# Patient Record
Sex: Male | Born: 1995 | Race: White | Hispanic: No | Marital: Single | Smoking: Never smoker
Health system: Southern US, Community
[De-identification: ages and names within clinical notes are randomized; demographics above are authoritative.]

## PROBLEM LIST (undated history)

## (undated) DIAGNOSIS — F840 Autistic disorder: Secondary | ICD-10-CM

## (undated) DIAGNOSIS — B019 Varicella without complication: Secondary | ICD-10-CM

## (undated) DIAGNOSIS — R569 Unspecified convulsions: Secondary | ICD-10-CM

## (undated) DIAGNOSIS — T7840XA Allergy, unspecified, initial encounter: Secondary | ICD-10-CM

## (undated) DIAGNOSIS — M40209 Unspecified kyphosis, site unspecified: Secondary | ICD-10-CM

## (undated) DIAGNOSIS — M419 Scoliosis, unspecified: Secondary | ICD-10-CM

## (undated) DIAGNOSIS — K219 Gastro-esophageal reflux disease without esophagitis: Secondary | ICD-10-CM

## (undated) DIAGNOSIS — F909 Attention-deficit hyperactivity disorder, unspecified type: Secondary | ICD-10-CM

## (undated) DIAGNOSIS — Z8489 Family history of other specified conditions: Secondary | ICD-10-CM

## (undated) HISTORY — DX: Gastro-esophageal reflux disease without esophagitis: K21.9

## (undated) HISTORY — DX: Autistic disorder: F84.0

## (undated) HISTORY — DX: Scoliosis, unspecified: M41.9

## (undated) HISTORY — DX: Allergy, unspecified, initial encounter: T78.40XA

## (undated) HISTORY — DX: Varicella without complication: B01.9

## (undated) HISTORY — DX: Unspecified kyphosis, site unspecified: M40.209

## (undated) HISTORY — DX: Attention-deficit hyperactivity disorder, unspecified type: F90.9

---
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
## 2013-02-07 ENCOUNTER — Telehealth: Payer: Self-pay | Admitting: Internal Medicine

## 2013-02-07 NOTE — Telephone Encounter (Signed)
excedrine is low risk

## 2013-02-07 NOTE — Telephone Encounter (Signed)
Patient Information:  Caller Name: Alvino Chapel  Phone: [phone]  Patient: Marvin Thompson, Marvin Thompson  Gender: Male  DOB: [birth year]  Age: 17 Years  PCP: Darryll Capers (Adults only)  Office Follow Up:  Does the office need to follow up with this patient?: Yes  Instructions For The Office: Mom would like to know if MD thinks it is ok to use Excedrin for ha's or if there is something else she should use.  Using Ibuprofen 200-400 mg prn.  RN Note:  Please f/u with mom with suggestions for tx medication for ha's.  Is Excedrin ok?  Symptoms  Reason For Call & Symptoms: Headaches behind eyes.  Mom thinks it may be migraines and not sure if she could give him Excedrin?  Last seizure was 10/30 and noticed increase in ha's since then.  Always has a really bad ha after seizures for a few days but this seems to be lasting longer;  They are more frequent, pretty much daily.  No recent medication changes post the last seizure.  Sometimes see's s/s that he may be having visual disturbances or trying to clear his vision, notices this daily but has seen all these s/s before post seizure;  Did speak with Neuro at the time of the sz;  States it takes a few days to get thru to neuro so she would like to know if MD can suggest any other tx/medication's that would be ok to give?  Reviewed Health History In EMR: Yes  Reviewed Medications In EMR: Yes  Reviewed Allergies In EMR: Yes  Reviewed Surgeries / Procedures: Yes  Date of Onset of Symptoms: 02/23/2013  Treatments Tried: Ibuprofen 200 mg - 400 mg  Treatments Tried Worked: No  Guideline(s) Used:  Headache  Headache  Disposition Per Guideline:   Home Care  Reason For Disposition Reached:   Mild headache  Advice Given:  N/A  Patient Will Follow Care Advice:  YES

## 2013-02-07 NOTE — Telephone Encounter (Signed)
Pt's mom aware.

## 2013-02-11 ENCOUNTER — Other Ambulatory Visit: Payer: Self-pay

## 2013-02-11 DIAGNOSIS — F848 Other pervasive developmental disorders: Secondary | ICD-10-CM

## 2013-02-11 DIAGNOSIS — G40309 Generalized idiopathic epilepsy and epileptic syndromes, not intractable, without status epilepticus: Secondary | ICD-10-CM

## 2013-02-11 MED ORDER — INTUNIV 2 MG PO TB24: ORAL_TABLET | ORAL | Status: DC

## 2013-02-24 ENCOUNTER — Telehealth: Payer: Self-pay | Admitting: Internal Medicine

## 2013-02-24 NOTE — Telephone Encounter (Signed)
Seizure 02/21/13 for 2 minutes; treated at home.  Called neurologist and left message for follow up.  Neurologist recommended another MRI with general anesthesia but Marvin Thompson had one in March 2014.  Asking for Dr York Ram opinion regarding if repeat MRI is needed.  Child on Medicaid; pediatrician listed as primary. Having difficulty getting referrals.  Asking how soon will Dr Lovell Sheehan accept Medicaid patients?  Please call back.

## 2013-02-24 NOTE — Telephone Encounter (Signed)
Unsure of when we will accept medicaid The MRI is a reasonable idea

## 2013-02-25 NOTE — Telephone Encounter (Signed)
Pt mother would like to drop off urine sample to make sure pt does not have any inf that could have cause his seizure. Can I sch?

## 2013-02-25 NOTE — Telephone Encounter (Signed)
pts mom wanted to know when Dr Lovell Sheehan was going to get his name added to [facility] card.  Explained to mom that our office does not handle that and she needed to contact medicaid and ask them what she could do.  Told mom that Dr Lovell Sheehan recommended the MRI and she stated she could not get a call back.  Dr Modesto Charon was the one who is setting up referral at [facility].  Told mom that she would need to contact his office since we are not the ones referring.  Apologized to mom that I could not of been any help.  She said her son had another seizure and she was really wanting a referral to [facility].  Note forwarded to Dr Lovell Sheehan for review.  I told mom that if pt got another seizure she should take him to the ER.

## 2013-02-26 ENCOUNTER — Encounter: Payer: Self-pay | Admitting: Pediatrics

## 2013-02-26 ENCOUNTER — Ambulatory Visit (INDEPENDENT_AMBULATORY_CARE_PROVIDER_SITE_OTHER): Payer: BC Managed Care – PPO | Admitting: Pediatrics

## 2013-02-26 ENCOUNTER — Ambulatory Visit: Payer: BC Managed Care – PPO | Admitting: Pediatrics

## 2013-02-26 VITALS — Wt 165.5 lb

## 2013-02-26 DIAGNOSIS — R350 Frequency of micturition: Secondary | ICD-10-CM | POA: Insufficient documentation

## 2013-02-26 LAB — POCT URINALYSIS DIPSTICK
Blood, UA: NEGATIVE
Nitrite, UA: NEGATIVE
Protein, UA: NEGATIVE
Spec Grav, UA: 1.015
Urobilinogen, UA: 1

## 2013-02-26 NOTE — Patient Instructions (Signed)

## 2013-02-26 NOTE — Telephone Encounter (Signed)
Talked with mom about medicaid and primary

## 2013-02-26 NOTE — Progress Notes (Signed)
Subjective:    Marvin Thompson is a 17 y.o. male with history of autism and seizures who complains of dysuria and hesitancy for 2 days.  Patient also complains of increasing sizures. Patient denies back pain and fever.  Patient does not have a history of recurrent UTI.  Patient does not have a history of pyelonephritis. The following portions of the patient's history were reviewed and updated as appropriate: allergies, current medications, past family history, past medical history, past social history, past surgical history and problem list. On seizure meds--Zosonamide and Lamictal Review of Systems Pertinent items are noted in HPI.    Objective:    Wt 165 lb 8 oz (75.07 kg) General: alert  Abdomen: soft, non-tender, without masses or organomegaly   Back: back muscles are full ROM  GU: defer exam   Laboratory:  Urine dipstick shows negative for all components.   Micro exam: not done.    Assessment:    Genital irritation  Possible medication side effect   Plan: Plan:    1. Medications: not indicated at this time 2. Maintain adequate hydration 3. Follow up if symptoms not improving, and prn.

## 2013-03-10 ENCOUNTER — Encounter: Payer: Self-pay | Admitting: Family Medicine

## 2013-03-10 ENCOUNTER — Ambulatory Visit (INDEPENDENT_AMBULATORY_CARE_PROVIDER_SITE_OTHER): Payer: BC Managed Care – PPO | Admitting: Family Medicine

## 2013-03-10 VITALS — BP 110/70 | HR 96 | Ht 73.0 in | Wt 163.0 lb

## 2013-03-10 DIAGNOSIS — L708 Other acne: Secondary | ICD-10-CM

## 2013-03-10 DIAGNOSIS — F840 Autistic disorder: Secondary | ICD-10-CM

## 2013-03-10 DIAGNOSIS — J309 Allergic rhinitis, unspecified: Secondary | ICD-10-CM | POA: Insufficient documentation

## 2013-03-10 DIAGNOSIS — G40309 Generalized idiopathic epilepsy and epileptic syndromes, not intractable, without status epilepticus: Secondary | ICD-10-CM

## 2013-03-10 DIAGNOSIS — Z79899 Other long term (current) drug therapy: Secondary | ICD-10-CM

## 2013-03-10 DIAGNOSIS — L709 Acne, unspecified: Secondary | ICD-10-CM | POA: Insufficient documentation

## 2013-03-10 MED ORDER — CLINDAMYCIN PHOS-BENZOYL PEROX 1-5 % EX GEL: Freq: Two times a day (BID) | CUTANEOUS | Status: DC

## 2013-03-10 MED ORDER — CEPHALEXIN 500 MG PO CAPS: 500.0000 mg | ORAL_CAPSULE | Freq: Two times a day (BID) | ORAL | Status: DC

## 2013-03-10 NOTE — Progress Notes (Signed)
   Subjective:    Patient ID: Marvin Thompson, male    DOB: 1996-01-06, 17 y.o.   MRN: [id number]  HPI He is here for an initial get acquainted visit. He does have an underlying history of autism and recently developed a seizure disorder. He was in high school and did graduate from high school. He did swim on the swim team. Apparently since the seizure disorder his symptom complex has changed. He has required a lot more intervention from his mother who is his primary caregiver. She also notes that he has times where he stares and then comes right out of it. He usually has his seizures once per month. He apparently is going to be seen in the near future at [facility] and potentially admitted for observation. He also has a history of allergies and did have asthma as a child. His mother has him on Claritin right now for sneezing and rhinorrhea. He also has a history of acne and has been on topical antibiotic as well as cephalexin which has worked in the past.   Review of Systems     Objective:   Physical Exam alert and in no distress. Tympanic membranes and canals are normal. Throat is clear. Tonsils are normal. Neck is supple without adenopathy or thyromegaly. Cardiac exam shows a regular sinus rhythm without murmurs or gallops. Lungs are clear to auscultation. Inflammatory type acne is noted on his face and on his upper back.        Assessment & Plan:  Acne - Plan: cephALEXin (KEFLEX) 500 MG capsule, clindamycin-benzoyl peroxide (BENZACLIN WITH PUMP) gel  Autism  Encounter for long-term (current) use of other medications  Generalized convulsive epilepsy without mention of intractable epilepsy  Allergic rhinitis  his antibiotic and topical medication were renewed. He will continue to be followed by urology at [facility]. I will work with the mother as much is possible to help with his care. It will take a while to finally get a good handle on where he is clinically compared to prior to  his seizures.

## 2013-03-24 ENCOUNTER — Other Ambulatory Visit: Payer: Self-pay

## 2013-03-24 DIAGNOSIS — G40309 Generalized idiopathic epilepsy and epileptic syndromes, not intractable, without status epilepticus: Secondary | ICD-10-CM

## 2013-03-24 DIAGNOSIS — F848 Other pervasive developmental disorders: Secondary | ICD-10-CM

## 2013-03-24 MED ORDER — INTUNIV 2 MG PO TB24: ORAL_TABLET | ORAL | Status: DC

## 2013-04-02 ENCOUNTER — Ambulatory Visit: Payer: BC Managed Care – PPO | Admitting: Internal Medicine

## 2013-04-09 ENCOUNTER — Encounter: Payer: Self-pay | Admitting: Internal Medicine

## 2013-04-09 ENCOUNTER — Ambulatory Visit (INDEPENDENT_AMBULATORY_CARE_PROVIDER_SITE_OTHER): Payer: BC Managed Care – PPO | Admitting: Internal Medicine

## 2013-04-09 VITALS — BP 110/70 | HR 76 | Temp 98.0°F | Resp 16 | Ht 73.2 in | Wt 166.0 lb

## 2013-04-09 DIAGNOSIS — F32A Depression, unspecified: Secondary | ICD-10-CM

## 2013-04-09 DIAGNOSIS — F329 Major depressive disorder, single episode, unspecified: Secondary | ICD-10-CM

## 2013-04-09 DIAGNOSIS — F3289 Other specified depressive episodes: Secondary | ICD-10-CM

## 2013-04-09 DIAGNOSIS — G40309 Generalized idiopathic epilepsy and epileptic syndromes, not intractable, without status epilepticus: Secondary | ICD-10-CM

## 2013-04-09 DIAGNOSIS — F840 Autistic disorder: Secondary | ICD-10-CM

## 2013-04-09 DIAGNOSIS — F848 Other pervasive developmental disorders: Secondary | ICD-10-CM

## 2013-04-09 NOTE — Patient Instructions (Signed)
The patient is instructed to continue all medications as prescribed. Schedule followup with check out clerk upon leaving the clinic  

## 2013-04-09 NOTE — Progress Notes (Signed)
Pre visit review using our clinic review tool, if applicable. No additional management support is needed unless otherwise documented below in the visit note. 

## 2013-04-09 NOTE — Progress Notes (Signed)
Subjective:    Patient ID: Marvin Thompson, male    DOB: 10/07/1995, 18 y.o.   MRN: [id number]  HPI    Review of Systems  Constitutional: Positive for activity change and fatigue.  HENT: Positive for postnasal drip.   Skin:       Cystic acne  Neurological: Positive for speech difficulty.       Autistic   Past Medical History  Diagnosis Date  . Allergy   . Asthma   . ADHD (attention deficit hyperactivity disorder)   . Autism   . Autism   . Chicken pox   . GERD (gastroesophageal reflux disease)   . Kyphosis   . Scoliosis   . Family history of anesthesia complication     Mother vomitted  . 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 Marital Status: Single    Spouse Name: N/A    Number of Children: N/A  . Years of Education: N/A   Occupational History  . [employer]    Social History Main Topics  . Smoking status: Never Smoker   . Smokeless tobacco: Never Used  . Alcohol Use: No  . Drug Use: No  . Sexual Activity: No   Other Topics Concern  . Not on file   Social History Narrative  . No narrative on file    Past Surgical History  Procedure Laterality Date  . Radiology with anesthesia  04/29/2012    Procedure: RADIOLOGY WITH ANESTHESIA;  Surgeon: Medication Radiologist, MD;  Location: [facility] OR;  Service: Radiology;  Laterality: N/A;  MR BRAIN WITHOUT CONTRAST DR. HICKLING/MRI    Family History  Problem Relation Age of Onset  . Alcohol abuse    . Arthritis    . Diabetes    . Hyperlipidemia Father   . Heart disease Father   . Hypertension Father   . Migraines Mother     No Known Allergies  Current Outpatient Prescriptions on File Prior to Visit  Medication Sig Dispense Refill  . clindamycin-benzoyl peroxide (BENZACLIN WITH PUMP) gel Apply topically 2 (two) times daily.  50 g  5  . diazepam (DIASTAT ACUDIAL) 10 MG GEL Place 10 mg rectally once.  2 mg  0  . FLUoxetine (PROZAC) 20 MG/5ML solution Take 15 mg by  mouth daily.      Marland Kitchen. ibuprofen (ADVIL,MOTRIN) 200 MG tablet Take 400 mg by mouth every 8 (eight) hours as needed for pain.      . INTUNIV 2 MG TB24 SR tablet Take 1 tab by mouth at bedtime  30 tablet  0  . lamoTRIgine (LAMICTAL) 200 MG tablet Take 200 mg by mouth 2 (two) times daily. PT TAKES 100 MG AM AND 200 MG PM      . loratadine (CLARITIN) 10 MG tablet Take 10 mg by mouth daily as needed. For seasonal allergies      . Multiple Vitamin (MULTIVITAMIN) tablet Take 1 tablet by mouth daily.      Marland Kitchen. tretinoin (RETIN-A) 0.1 % cream Apply 1 application topically at bedtime.       Marland Kitchen. zonisamide (ZONEGRAN) 100 MG capsule Take 100 mg by mouth 3 (three) times daily. PT TAKES 300 MG AT BEDTIME       No current facility-administered medications on file prior to visit.    BP 110/70  Pulse 76  Temp(Src) 98 F (36.7 C)  Resp 16  Ht 6' 1.2" (1.859  m)  Wt 166 lb (75.297 kg)  BMI 21.79 kg/m2        Objective:   Physical Exam  Nursing note and vitals reviewed. Constitutional: He is oriented to person, place, and time. He appears well-developed and well-nourished.  HENT:  Head: Normocephalic and atraumatic.  Eyes: Conjunctivae are normal. Pupils are equal, round, and reactive to light.  Neck: Normal range of motion. Neck supple.  Cardiovascular: Normal rate and regular rhythm.   Pulmonary/Chest: Effort normal and breath sounds normal.  Abdominal: Soft. Bowel sounds are normal.  Neurological: He is alert and oriented to person, place, and time.  Psychiatric: He has a normal mood and affect. His behavior is normal.          Assessment & Plan:   Seeing [employer] for medicaid but mother needed reassurance about frequent seizures. Light sensitive after started  depakote Mood and seizure better on the Depakote The zonagran may be contributing to the dilated eye And dizziness Possible opportunity to decrease the zonegran and increase the depakote  EEG at [facility]  Possible psych  referral to [facility]

## 2013-04-28 ENCOUNTER — Other Ambulatory Visit: Payer: Self-pay

## 2013-04-28 DIAGNOSIS — F848 Other pervasive developmental disorders: Secondary | ICD-10-CM

## 2013-04-28 DIAGNOSIS — G40309 Generalized idiopathic epilepsy and epileptic syndromes, not intractable, without status epilepticus: Secondary | ICD-10-CM

## 2013-04-28 MED ORDER — INTUNIV 2 MG PO TB24: ORAL_TABLET | ORAL | Status: DC

## 2013-04-29 ENCOUNTER — Telehealth: Payer: Self-pay | Admitting: Family Medicine

## 2013-04-29 NOTE — Telephone Encounter (Signed)
Called mother Alvino Chapelllen, she states they hospital gave her no written orders or information.  She will contact her social worker & if she can't help her she will call us back.

## 2013-04-29 NOTE — Telephone Encounter (Signed)
We can help her but we will need all the information from them before I can order any of that stuff. Usually the hospital will help arrange that so her best bet is to do it through the hospital and her social worker

## 2013-05-06 ENCOUNTER — Other Ambulatory Visit: Payer: Self-pay | Admitting: Family Medicine

## 2013-05-07 NOTE — Telephone Encounter (Signed)
Spoke with patient's mother, Marvin Thompson and they will no longer be seeing Dr.Jenkins-they want you to be PCP for Joanthan. The appt w/Dr.Jenkins this past January was already scheduled before they saw you in Dec 2014 so she just went ahead and kept appointment. Will solely be seeing you from now on and will be needing this rx filled. Please advise. Thanks.

## 2013-05-07 NOTE — Telephone Encounter (Signed)
Is this okay to refill? 

## 2013-05-07 NOTE — Telephone Encounter (Signed)
Apparently he has seen me and Dr. Lovell SheehanJenkins. Check with the mother on who she is going to use. She needs to choose one or the other but can't do both.

## 2013-05-08 ENCOUNTER — Telehealth: Payer: Self-pay | Admitting: Internal Medicine

## 2013-05-08 ENCOUNTER — Other Ambulatory Visit: Payer: Self-pay | Admitting: Family Medicine

## 2013-05-08 DIAGNOSIS — F848 Other pervasive developmental disorders: Secondary | ICD-10-CM

## 2013-05-08 DIAGNOSIS — G40309 Generalized idiopathic epilepsy and epileptic syndromes, not intractable, without status epilepticus: Secondary | ICD-10-CM

## 2013-05-08 MED ORDER — INTUNIV 2 MG PO TB24: ORAL_TABLET | ORAL | Status: DC

## 2013-05-08 MED ORDER — INTUNIV 2 MG PO TB24: ORAL_TABLET | ORAL | Status: AC

## 2013-05-08 NOTE — Telephone Encounter (Signed)
Called med in per jcl 

## 2013-05-08 NOTE — Telephone Encounter (Signed)
Called in med 

## 2013-10-21 ENCOUNTER — Ambulatory Visit (INDEPENDENT_AMBULATORY_CARE_PROVIDER_SITE_OTHER): Payer: BC Managed Care – PPO | Admitting: Family Medicine

## 2013-10-21 DIAGNOSIS — G40309 Generalized idiopathic epilepsy and epileptic syndromes, not intractable, without status epilepticus: Secondary | ICD-10-CM

## 2013-10-21 DIAGNOSIS — F840 Autistic disorder: Secondary | ICD-10-CM

## 2013-10-21 NOTE — Progress Notes (Signed)
   Subjective:    Patient ID: Marvin Thompson, male    DOB: 08/09/1995, 18 y.o.   MRN: [id number]  HPI He is here with his mother to have an FMLA form filled out for the father. She will be going back to school to finish her nursing degree and needs a form filled out so the father can get excused from work to help care for him. Primary caregivers and he does require constant supervision. He does have a history of seizures and was admitted to [facility] in January and treated for seizure disorder. 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   Review of Systems     Objective:   Physical Exam        Assessment & Plan:  Generalized convulsive epilepsy without mention of intractable epilepsy  Autism  appropriate paperwork was filled out on the FMLA to give the father 2 days off from work per week to help with his care.

## 2013-11-06 ENCOUNTER — Telehealth: Payer: Self-pay | Admitting: Family Medicine

## 2013-11-06 NOTE — Telephone Encounter (Signed)
Pt's father called and stated that [employer] had called concerning fmla papers. They told him some of the information needed correction. I call [employer] f[employer] at [phone] and referred to case number [id number]. They stated that no information was needed under section 6 title planned,regular schedule and that I could cross that out. They also stated that a end date for the end of the year 2015 was needed because they require a new fmla yearly. The date was added and form was faxed with corrections and approval of Wandra Scotiana Turner Practice manager.Corrected copies sent to scan center

## 2013-12-16 ENCOUNTER — Other Ambulatory Visit (INDEPENDENT_AMBULATORY_CARE_PROVIDER_SITE_OTHER): Payer: BC Managed Care – PPO

## 2013-12-16 DIAGNOSIS — Z23 Encounter for immunization: Secondary | ICD-10-CM

## 2014-03-09 ENCOUNTER — Ambulatory Visit (INDEPENDENT_AMBULATORY_CARE_PROVIDER_SITE_OTHER): Payer: BC Managed Care – PPO | Admitting: Family Medicine

## 2014-03-09 DIAGNOSIS — G40309 Generalized idiopathic epilepsy and epileptic syndromes, not intractable, without status epilepticus: Secondary | ICD-10-CM

## 2014-03-09 DIAGNOSIS — F840 Autistic disorder: Secondary | ICD-10-CM

## 2014-03-09 NOTE — Progress Notes (Signed)
   Subjective:    Patient ID: Marvin Thompson, male    DOB: 04/02/1995, 18 y.o.   MRN: [id number]  HPI  he is here with his mother. He is presently now on 3 psychotropic medications to help with his underlying  Involves of disorder. She needs an FMLA form filled out.   Review of Systems     Objective:   Physical Exam  alert and in no distress otherwise not examined       Assessment & Plan:  Autism  Generalized convulsive epilepsy  the FMLA form was filled out

## 2014-03-18 ENCOUNTER — Other Ambulatory Visit: Payer: Self-pay | Admitting: Family Medicine

## 2014-03-30 ENCOUNTER — Telehealth: Payer: Self-pay | Admitting: Internal Medicine

## 2014-03-30 NOTE — Telephone Encounter (Signed)
Needs an appointment.

## 2014-03-30 NOTE — Telephone Encounter (Signed)
[facility] request [facility]in  [facility] [place]. The dad had requested this from the pharmacy

## 2014-03-30 NOTE — Telephone Encounter (Signed)
I called mom she said that this is for patient acne that he has a physical 04/14/14 that when they came and saw you last you said you could handle the RX please advise

## 2014-03-31 ENCOUNTER — Other Ambulatory Visit: Payer: Self-pay

## 2014-03-31 ENCOUNTER — Telehealth: Payer: Self-pay | Admitting: Internal Medicine

## 2014-03-31 DIAGNOSIS — L709 Acne, unspecified: Secondary | ICD-10-CM

## 2014-03-31 MED ORDER — CEPHALEXIN 500 MG PO CAPS: 500.0000 mg | ORAL_CAPSULE | Freq: Two times a day (BID) | ORAL | Status: DC

## 2014-03-31 NOTE — Telephone Encounter (Signed)
Dr.Lalonde has already sent in

## 2014-03-31 NOTE — Telephone Encounter (Signed)
This is fine. Check on the dosing regimen and given 90 day supply with 3 refills

## 2014-03-31 NOTE — Telephone Encounter (Signed)
Pharmacy called and states that pt father is waiting at the pharmacy for med that was never sent to pharmacy. Per [employer] [facility] prior, find out dosing regimen, and per pharmacy they have been filling it for cephalexin 500mg  1 tablet twice a day. orginially filled on 03/10/13 and then refilled on 01/08/14

## 2014-04-14 ENCOUNTER — Ambulatory Visit (INDEPENDENT_AMBULATORY_CARE_PROVIDER_SITE_OTHER): Payer: Medicaid Other | Admitting: Family Medicine

## 2014-04-14 ENCOUNTER — Encounter: Payer: Self-pay | Admitting: Family Medicine

## 2014-04-14 VITALS — BP 118/78 | HR 76 | Ht 72.5 in | Wt 188.0 lb

## 2014-04-14 DIAGNOSIS — J301 Allergic rhinitis due to pollen: Secondary | ICD-10-CM

## 2014-04-14 DIAGNOSIS — L700 Acne vulgaris: Secondary | ICD-10-CM

## 2014-04-14 DIAGNOSIS — Z0000 Encounter for general adult medical examination without abnormal findings: Secondary | ICD-10-CM

## 2014-04-14 DIAGNOSIS — F840 Autistic disorder: Secondary | ICD-10-CM

## 2014-04-14 NOTE — Progress Notes (Signed)
   Subjective:    Patient ID: Marvin Thompson, male    DOB: 04/19/1995, 19 y.o.   MRN: [id number]  HPI He is here for a complete examination. He is followed neurologically by Dr. Modesto CharonWong at [facility]. He seems to be stable on his present medications in regard to his seizures. He also sees Dr. Emerson MonteParrish McKinney for his autism and again medications seem to be working well. He does have acne and is on medications for that as well as for allergic rhinitis. His immunizations are up-to-date. His mother is his primary caregiver. Family and social history was reviewed. His mother has no other concerns or complaints.   Review of Systems  All other systems reviewed and are negative.      Objective:   Physical Exam BP 118/78 mmHg  Pulse 76  Ht 6' 0.5" (1.842 m)  Wt 188 lb (85.276 kg)  BMI 25.13 kg/m2  SpO2 99%  General Appearance:    Alert, cooperative, no distress, appears stated age  Head:    Normocephalic, without obvious abnormality, atraumatic  Eyes:    PERRL, conjunctiva/corneas clear, EOM's intact, fundi    benign  Ears:    Normal TM's and external ear canals  Nose:   Nares normal, mucosa normal, no drainage or sinus   tenderness  Throat:   Lips, mucosa, and tongue normal; teeth and gums normal  Neck:   Supple, no lymphadenopathy;  thyroid:  no   enlargement/tenderness/nodules; no carotid   bruit or JVD  Back:    Spine nontender, no curvature, ROM normal, no CVA     tenderness  Lungs:     Clear to auscultation bilaterally without wheezes, rales or     ronchi; respirations unlabored  Chest Wall:    No tenderness or deformity   Heart:    Regular rate and rhythm, S1 and S2 normal, no murmur, rub   or gallop  Breast Exam:    No chest wall tenderness, masses or gynecomastia  Abdomen:     Soft, non-tender, nondistended, normoactive bowel sounds,    no masses, no hepatosplenomegaly  Genitalia:    Normal male external genitalia without lesions.  Testicles without masses.  No inguinal hernias.    Rectal:   Deferred due to age <40 and lack of symptoms  Extremities:   No clubbing, cyanosis or edema  Pulses:   2+ and symmetric all extremities  Skin:   Skin color, texture, turgor normal, no rashes or lesions  Lymph nodes:   Cervical, supraclavicular, and axillary nodes normal  Neurologic:   CNII-XII intact, normal strength, sensation and gait; reflexes 2+ and symmetric throughout          Psych:   Normal mood, affect, hygiene and grooming.          Assessment & Plan:  Acne vulgaris  Allergic rhinitis due to pollen  Autism  he will continue to be followed by the same neurologist and psychiatrist. Did mention that I can take care of his acne as well as allergy medicines since he seems to be stable on them.

## 2014-05-22 ENCOUNTER — Other Ambulatory Visit: Payer: Self-pay | Admitting: Family Medicine

## 2014-05-22 NOTE — Telephone Encounter (Signed)
Is this okay to refill? 

## 2014-12-22 ENCOUNTER — Other Ambulatory Visit (INDEPENDENT_AMBULATORY_CARE_PROVIDER_SITE_OTHER): Payer: Medicaid Other

## 2014-12-22 DIAGNOSIS — Z23 Encounter for immunization: Secondary | ICD-10-CM

## 2015-02-27 ENCOUNTER — Other Ambulatory Visit: Payer: Self-pay | Admitting: Family Medicine

## 2015-03-01 NOTE — Telephone Encounter (Signed)
Is this ok to refill?  

## 2015-03-13 ENCOUNTER — Other Ambulatory Visit: Payer: Self-pay | Admitting: Family Medicine

## 2015-03-15 NOTE — Telephone Encounter (Signed)
Is this okay to refill, patient uses for acne.

## 2015-06-21 ENCOUNTER — Ambulatory Visit (INDEPENDENT_AMBULATORY_CARE_PROVIDER_SITE_OTHER): Payer: Medicaid Other | Admitting: Family Medicine

## 2015-06-21 ENCOUNTER — Encounter: Payer: Self-pay | Admitting: Family Medicine

## 2015-06-21 VITALS — BP 130/78 | HR 82 | Ht 73.0 in | Wt 199.2 lb

## 2015-06-21 DIAGNOSIS — Z0000 Encounter for general adult medical examination without abnormal findings: Secondary | ICD-10-CM

## 2015-06-21 DIAGNOSIS — F840 Autistic disorder: Secondary | ICD-10-CM | POA: Diagnosis not present

## 2015-06-21 DIAGNOSIS — G40309 Generalized idiopathic epilepsy and epileptic syndromes, not intractable, without status epilepticus: Secondary | ICD-10-CM

## 2015-06-21 NOTE — Progress Notes (Signed)
   Subjective:    Patient ID: Marvin Thompson, male    DOB: 12/24/1995, 20 y.o.   MRN: [id number]  HPI He is here for complete examination. He has not had any seizures in over 2 years. [facility] is [facility] by Dr. Modesto CharonWong. He was seen recently by him. That record was reviewed including blood work. Father and mother take good care of him. He was at one point and a daycare environment apparently this did not work out. They still have connections with [employer] [facility] symptoms sparingly. His activity level is limited and he has gained weight his father is concerned it could be medication related. They have no other concerns or complaints. Family and social history was reviewed as well as immunizations and health maintenance.   Review of Systems     Objective:   Physical Exam BP 130/78 mmHg  Pulse 82  Ht 6\' 1"  (1.854 m)  Wt 199 lb 3.2 oz (90.357 kg)  BMI 26.29 kg/m2  SpO2 99%  General Appearance:    Alert, cooperative, no distress, appears stated age  Head:    Normocephalic, without obvious abnormality, atraumatic  Eyes:    PERRL, conjunctiva/corneas clear, EOM's intact, fundi    benign  Ears:    Normal TM's and external ear canals  Nose:   Nares normal, mucosa normal, no drainage or sinus   tenderness  Throat:   Lips, mucosa, and tongue normal; teeth and gums normal  Neck:   Supple, no lymphadenopathy;  thyroid:  no   enlargement/tenderness/nodules; no carotid   bruit or JVD  Back:    Spine nontender, no curvature, ROM normal, no CVA     tenderness  Lungs:     Clear to auscultation bilaterally without wheezes, rales or     ronchi; respirations unlabored  Chest Wall:    No tenderness or deformity   Heart:    Regular rate and rhythm, S1 and S2 normal, no murmur, rub   or gallop  Breast Exam:    No chest wall tenderness, masses or gynecomastia  Abdomen:     Soft, non-tender, nondistended, normoactive bowel sounds,    no masses, no hepatosplenomegaly  Genitalia:     Normal male external genitalia without lesions.  Testicles without masses.  No inguinal hernias.  Rectal:   Deferred due to age <40 and lack of symptoms  Extremities:   No clubbing, cyanosis or edema  Pulses:   2+ and symmetric all extremities  Skin:   Skin color, texture, turgor normal, no rashes or lesions  Lymph nodes:   Cervical, supraclavicular, and axillary nodes normal  Neurologic:   CNII-XII intact, normal strength, sensation and gait; reflexes 2+ and symmetric throughout          Psych:   Normal mood, affect, hygiene and grooming.         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 Discussed physical activity as well as cutting back on carbohydrates. Recommend he discuss with the neurologist whether the medications could also be causing weight gain.

## 2015-06-28 ENCOUNTER — Telehealth: Payer: Self-pay | Admitting: Family Medicine

## 2015-06-28 NOTE — Telephone Encounter (Signed)
Pt dad called and needs a paper filled out pt Marvin Thompson to participate in [employer]. Put in you folder,.they need this by tues, they said you could email this back to them  At [email or web address] or call them at [phone] or [phone]

## 2015-06-28 NOTE — Telephone Encounter (Signed)
Dr Susann GivensLalonde completed the form and I emailed the form back to father, Aurther Lofterry. Left a message for father to let him know that form has been emailed and if he wanted to pick it up or wanted something else done with the form to call us back

## 2015-07-24 ENCOUNTER — Other Ambulatory Visit: Payer: Self-pay | Admitting: Family Medicine

## 2015-07-26 NOTE — Telephone Encounter (Signed)
Is this okay to refill? 

## 2015-11-08 ENCOUNTER — Telehealth: Payer: Self-pay | Admitting: Family Medicine

## 2015-11-08 NOTE — Telephone Encounter (Signed)
Request for Medication list signed & [facility]ed to [facility]

## 2016-03-13 ENCOUNTER — Telehealth: Payer: Self-pay

## 2016-03-13 MED ORDER — CEPHALEXIN 500 MG PO CAPS: ORAL_CAPSULE | ORAL | 3 refills | Status: AC

## 2016-03-13 NOTE — Telephone Encounter (Signed)
Refill request for [facility]phal[facility]in 500mg  capsules [facility]. Trixie Rude/RLB

## 2016-06-05 ENCOUNTER — Ambulatory Visit: Payer: Medicaid Other | Admitting: Family Medicine

## 2017-06-27 ENCOUNTER — Encounter: Payer: Self-pay | Admitting: Family Medicine

## 2021-07-24 ENCOUNTER — Other Ambulatory Visit: Payer: Self-pay

## 2021-07-24 ENCOUNTER — Encounter (HOSPITAL_BASED_OUTPATIENT_CLINIC_OR_DEPARTMENT_OTHER): Payer: Self-pay | Admitting: Emergency Medicine

## 2021-07-24 ENCOUNTER — Emergency Department (HOSPITAL_BASED_OUTPATIENT_CLINIC_OR_DEPARTMENT_OTHER): Payer: BC Managed Care – PPO | Admitting: Radiology

## 2021-07-24 ENCOUNTER — Emergency Department (HOSPITAL_BASED_OUTPATIENT_CLINIC_OR_DEPARTMENT_OTHER)
Admission: EM | Admit: 2021-07-24 | Discharge: 2021-07-24 | Disposition: A | Discharge status: Home / Self Care | Payer: BC Managed Care – PPO | Attending: Emergency Medicine | Admitting: Emergency Medicine

## 2021-07-24 DIAGNOSIS — Y9301 Activity, walking, marching and hiking: Secondary | ICD-10-CM | POA: Diagnosis not present

## 2021-07-24 DIAGNOSIS — S8262XA Displaced fracture of lateral malleolus of left fibula, initial encounter for closed fracture: Secondary | ICD-10-CM | POA: Diagnosis not present

## 2021-07-24 DIAGNOSIS — Z79899 Other long term (current) drug therapy: Secondary | ICD-10-CM | POA: Diagnosis not present

## 2021-07-24 DIAGNOSIS — F840 Autistic disorder: Secondary | ICD-10-CM | POA: Diagnosis not present

## 2021-07-24 DIAGNOSIS — S99912A Unspecified injury of left ankle, initial encounter: Secondary | ICD-10-CM | POA: Diagnosis present

## 2021-07-24 DIAGNOSIS — X501XXA Overexertion from prolonged static or awkward postures, initial encounter: Secondary | ICD-10-CM | POA: Diagnosis not present

## 2021-07-24 DIAGNOSIS — S82892A Other fracture of left lower leg, initial encounter for closed fracture: Secondary | ICD-10-CM

## 2021-07-24 MED ORDER — NAPROXEN 500 MG PO TABS: 500.0000 mg | ORAL_TABLET | Freq: Two times a day (BID) | ORAL | 0 refills | Status: AC

## 2021-07-24 MED ORDER — NAPROXEN 500 MG PO TABS: 500.0000 mg | ORAL_TABLET | Freq: Two times a day (BID) | ORAL | 0 refills | Status: DC

## 2021-07-24 NOTE — ED Provider Notes (Signed)
?[name] ?Provider Note ? ? ?CSN: [id number] ?Arrival date & time: 07/24/21  1447 ? ?  ? ?History ? ?Chief Complaint  ?Patient presents with  ? Ankle Injury  ?  left  ? ? ?Marvin Thompson is a 26 y.o. male with chief complaint of left ankle pain following injury today.  Patient was walking around the [facility] when he rolled his ankle.  Denies numbness, tingling, reduced range of motion of the foot/toes.  Endorses increased swelling of the ankle joint.  No Hx of prior surgery or injury to the left ankle/foot.  Injury is closed injury.  Hx of autism, generalized convulsive epilepsy, developmental disorder, ADHD, GERD. ? ?The history is provided by the patient and medical records.  ?Ankle Injury ? ? ?  ? ?Home Medications ?Prior to Admission medications   ?Medication Sig Start Date End Date Taking? Authorizing Provider  ?BENZACLIN WITH PUMP gel APPLY TOPICALLY 2 (TWO) TIMES DAILY. 07/26/15   Denita Lung, MD  ?cephALEXin (KEFLEX) 500 MG capsule TAKE 1 CAPSULE (500 MG TOTAL) BY MOUTH 2 (TWO) TIMES DAILY. 03/13/16   Denita Lung, MD  ?diazepam (DIASAT) 20 MG GEL Place 15 mg rectally as needed.    [provider]  ?divalproex (DEPAKOTE) 500 MG DR tablet Take 500 mg by mouth 2 (two) times daily.    [provider]  ?FLUoxetine (PROZAC) 40 MG capsule Take 40 mg by mouth daily.    [provider]  ?gabapentin (NEURONTIN) 100 MG capsule Take 100 mg by mouth daily.    [provider]  ?ibuprofen (ADVIL,MOTRIN) 200 MG tablet Take 400 mg by mouth every 8 (eight) hours as needed for pain. PRN    [provider]  ?INTUNIV 2 MG TB24 SR tablet Take 1 tab by mouth at bedtime 05/08/13   Denita Lung, MD  ?lamoTRIgine (LAMICTAL) 100 MG tablet Take 100 mg by mouth 2 (two) times daily.    [provider]  ?loratadine (CLARITIN) 10 MG tablet Take 10 mg by mouth daily as needed. For seasonal allergies    [provider]  ?Multiple Vitamin  (MULTIVITAMIN) tablet Take 1 tablet by mouth daily.    [provider]  ?naproxen (NAPROSYN) 500 MG tablet Take 1 tablet (500 mg total) by mouth 2 (two) times daily. [id number]   Prince Rome, PA-C  ?Olopatadine HCl (PATADAY OP) Apply to eye.    [provider]  ?tretinoin (RETIN-A) 0.05 % cream APPLY TO SKIN NIGHTLY 03/01/15   Denita Lung, MD  ?tretinoin (RETIN-A) 0.1 % cream Apply 1 application topically at bedtime.  04/09/12   [provider]  ?   ? ?Allergies    ?Patient has no known allergies.   ? ?Review of Systems   ?Review of Systems  ?Musculoskeletal:   ?     Ankle pain, left  ? ?Physical Exam ?Updated Vital Signs ?BP 124/70 (BP Location: Right Arm)   Pulse 90   Temp 99.2 ?F (37.3 ?C)   Resp 18   Wt 90.7 kg   SpO2 100%   BMI 26.39 kg/m?  ?Physical Exam ?Vitals and nursing note reviewed.  ?Constitutional:   ?   General: He is not in acute distress. ?   Appearance: He is well-developed.  ?HENT:  ?   Head: Normocephalic and atraumatic.  ?Eyes:  ?   Conjunctiva/sclera: Conjunctivae normal.  ?Cardiovascular:  ?   Rate and Rhythm: Normal rate and regular rhythm.  ?  Pulses: Normal pulses.  ?   Heart sounds: Normal heart sounds. No murmur heard. ?Pulmonary:  ?   Effort: Pulmonary effort is normal. No respiratory distress.  ?   Breath sounds: Normal breath sounds.  ?Abdominal:  ?   Palpations: Abdomen is soft.  ?   Tenderness: There is no abdominal tenderness.  ?Musculoskeletal:     ?   General: Signs of injury present. No swelling.  ?   Cervical back: Neck supple.  ?Feet:  ?   Right foot:  ?   Skin integrity: Skin integrity normal.  ?   Left foot:  ?   Skin integrity: Skin integrity normal.  ?   Comments: Right ankle appears normal.  DP and PT pulses 2+ bilaterally.  Left ankle with mild-moderate local swelling.  No obvious evidence of ecchymoses, laceration, abrasion, obvious deformity, malalignment.  CRT less than 2 seconds. ?Skin: ?   General: Skin is warm and dry.  ?    Capillary Refill: Capillary refill takes less than 2 seconds.  ?Neurological:  ?   Mental Status: He is alert.  ?Psychiatric:     ?   Mood and Affect: Mood normal.  ? ? ?ED Results / Procedures / Treatments   ?Labs ?(all labs ordered are listed, but only abnormal results are displayed) ?Labs Reviewed - No data to display ? ?EKG ?None ? ?Radiology ?DG Ankle Complete Left ? ?Result Date: 07/24/2021 ?CLINICAL DATA:  Trauma, pain EXAM: LEFT ANKLE COMPLETE - 3+ VIEW COMPARISON:  None. FINDINGS: There is oblique fracture in the distal metaphyseal region of fibula. There is 2 mm offset in the alignment. There is no dislocation in the ankle joint. There is marked soft tissue swelling over the lateral malleolus. IMPRESSION: Slightly displaced fracture is seen in the distal metaphysis of left fibula. Electronically Signed   By: Elmer Picker M.D.   On: 07/24/2021 17:01   ? ?Procedures ?Procedures  ? ? ?Medications Ordered in ED ?Medications - No data to display ? ?ED Course/ Medical Decision Making/ A&P ?  ?                        ?Medical Decision Making ?Amount and/or Complexity of Data Reviewed ?External Data Reviewed: notes. ?Labs:  Decision-making details documented in ED Course. ?Radiology: ordered and independent interpretation performed. Decision-making details documented in ED Course. ?ECG/medicine tests:  Decision-making details documented in ED Course. ? ?Risk ?OTC drugs. ?Prescription drug management. ? ? ?26 y.o. male presents to the ED for concern of Ankle Injury (left) ? Marvin Thompson  This involves an extensive number of treatment options, and is a complaint that carries with it a high risk of complications and morbidity.  The emergent differential diagnosis prior to evaluation includes, but is not limited to: Fracture, dislocation, contusion, sprain ? ?This is not an exhaustive differential.  ? ?Past Medical History / Co-morbidities / Social History: ?Hx of autism, generalized convulsive epilepsy, developmental  disorder, ADHD, GERD. ? ?Additional History:  ?Internal and external records from outside source obtained and reviewed including ED visits, neurology, family medicine ? ?Physical Exam: ?Physical exam performed. The pertinent findings include: Mild to moderate effusion of the left ankle joint.  Closed injury.  No evidence of vascular compromise.  Lower extremities appear neurovascularly intact. ? ?Lab Tests: ?None ? ?Imaging Studies: ?I ordered imaging studies including x-ray of left ankle.  I independently visualized and interpreted said imaging.  Pertinent results include: ?Slightly displaced fracture is seen in  the distal metaphysis of left fibula ?I agree with the radiologist interpretation. ? ?Medications: ?None ? ?ED Course/Disposition: ?Pt well-appearing on exam.   Patient X-Ray with slightly displaced fracture of the left fibula and the ankle. Pain managed in ED.  Left lower extremity appears neurovascularly intact without evidence of ischemia.  Mild joint effusion of the left ankle appreciated.  Pt advised to follow up with orthopedics for further evaluation and treatment.  Pain managed in the department.  Patient offered cam boot, but states he already has one.  Offered crutches for the patient to utilize and to remain nonweightbearing until follow-up.  Emphasized and discussed conservative pain management as well. ? ?After consideration of the diagnostic results and the patient's encounter today, I feel that the emergency department workup does not suggest an emergent condition requiring admission or immediate intervention beyond what has been performed at this time.  The patient is safe for discharge and has been instructed to return immediately for worsening symptoms, change in symptoms or any other concerns.  Discussed course of treatment thoroughly with the patient and his family members, whom demonstrated understanding.  They're in agreement and have no further questions. ? ?I discussed this case with  my attending physician Dr. Doren Custard, who agreed with the proposed treatment course and cosigned this note including patient's presenting symptoms, physical exam, and planned diagnostics and interventions.  Attending p

## 2021-07-24 NOTE — Discharge Instructions (Signed)
X-ray imaging shows an acute fracture of the left ankle.  Please follow-up with orthopedics.  Contact information has been provided for you for the office of Dr. Roda Shutters.  Please call and schedule an appointment for continued medical management. ? ?Anti-inflammatories have been sent to your pharmacy.  1 tablet may be taken every 12 hours as needed for pain.  Also utilize RICE therapy as discussed. ? ?Return to the ED for new or worsening symptoms as discussed. ?

## 2021-07-24 NOTE — ED Triage Notes (Signed)
Left ankle injury , obvious deformity , edema and bruising .  ?

## 2021-07-29 ENCOUNTER — Other Ambulatory Visit: Payer: Self-pay | Admitting: Orthopaedic Surgery

## 2021-07-29 DIAGNOSIS — M25572 Pain in left ankle and joints of left foot: Secondary | ICD-10-CM

## 2021-08-01 ENCOUNTER — Ambulatory Visit
Admission: RE | Admit: 2021-08-01 | Discharge: 2021-08-01 | Disposition: A | Discharge status: Home / Self Care | Payer: BC Managed Care – PPO | Source: Ambulatory Visit | Attending: Orthopaedic Surgery | Admitting: Orthopaedic Surgery

## 2021-08-01 DIAGNOSIS — M25572 Pain in left ankle and joints of left foot: Secondary | ICD-10-CM

## 2023-09-07 ENCOUNTER — Encounter (INDEPENDENT_AMBULATORY_CARE_PROVIDER_SITE_OTHER): Payer: Self-pay | Admitting: Otolaryngology

## 2023-09-07 ENCOUNTER — Ambulatory Visit (INDEPENDENT_AMBULATORY_CARE_PROVIDER_SITE_OTHER): Payer: MEDICAID | Admitting: Otolaryngology

## 2023-09-07 VITALS — BP 113/74 | HR 78 | Ht 74.0 in | Wt 205.0 lb

## 2023-09-07 DIAGNOSIS — H6123 Impacted cerumen, bilateral: Secondary | ICD-10-CM

## 2023-09-07 DIAGNOSIS — H900 Conductive hearing loss, bilateral: Secondary | ICD-10-CM

## 2023-09-07 DIAGNOSIS — H902 Conductive hearing loss, unspecified: Secondary | ICD-10-CM | POA: Diagnosis not present

## 2023-09-09 DIAGNOSIS — H6123 Impacted cerumen, bilateral: Secondary | ICD-10-CM | POA: Insufficient documentation

## 2023-09-09 DIAGNOSIS — H900 Conductive hearing loss, bilateral: Secondary | ICD-10-CM | POA: Insufficient documentation

## 2023-09-09 NOTE — Progress Notes (Signed)
 CC: Clogging sensation in ears, muffled hearing  HPI:  Marvin Thompson is a 28 y.o. male who presents today with his father.  According to the patient, he has been experiencing clogging sensation in his ears.  It has resulted in muffled hearing.  He denies any otalgia, otorrhea, or vertigo.  The patient has no previous otologic surgery.  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 He has never used smokeless tobacco. He reports that he does not drink alcohol and does not use drugs.  Allergies: No Known Allergies  Prior to Admission medications   Medication Sig Start Date End Date Taking? Authorizing Provider  [employer] WITH PUMP gel APPLY TOPICALLY 2 (TWO) TIMES DAILY. 07/26/15  Yes Marvin Hacking, MD  cephALEXin  (KEFLEX ) 500 MG capsule TAKE 1 CAPSULE (500 MG TOTAL) BY MOUTH 2 (TWO) TIMES DAILY. 03/13/16  Yes Marvin Hacking, MD  diazepam  (DIASAT) 20 MG GEL Place 15 mg rectally as needed.   Yes [provider]  divalproex (DEPAKOTE) 500 MG DR tablet Take 500 mg by mouth 2 (two) times daily.   Yes [provider]  FLUoxetine (PROZAC) 40 MG capsule Take  40 mg by mouth daily.   Yes [provider]  gabapentin (NEURONTIN) 100 MG capsule Take 100 mg by mouth daily.   Yes [provider]  ibuprofen (ADVIL,MOTRIN) 200 MG tablet Take 400 mg by mouth every 8 (eight) hours as needed for pain. PRN   Yes [provider]  INTUNIV  2 MG TB24 SR tablet Take 1 tab by mouth at bedtime 05/08/13  Yes Marvin Hacking, MD  lamoTRIgine  (LAMICTAL ) 100 MG tablet Take 100 mg by mouth 2 (two) times daily.   Yes [provider]  loratadine (CLARITIN) 10 MG tablet Take 10 mg by mouth daily as needed. For seasonal allergies   Yes [provider]  Multiple Vitamin (MULTIVITAMIN) tablet Take 1 tablet by mouth daily.   Yes [provider]  naproxen  (NAPROSYN ) 500 MG tablet Take 1 tablet (500 mg total) by mouth 2 (two) times daily. 07/24/21  Yes Cockerham, Alicia M, PA-C  Olopatadine HCl (PATADAY OP) Apply to eye.   Yes [provider]  tretinoin (RETIN-A) 0.05 % cream APPLY TO SKIN NIGHTLY 03/01/15  Yes Lalonde, John C, MD  tretinoin (RETIN-A) 0.1 % cream Apply 1 application topically at bedtime.  04/09/12  Yes [provider]    Blood pressure 113/74, pulse 78, height 6' 2 (1.88 m),  weight 205 lb (93 kg). Exam: General: Communicates without difficulty, well nourished, no acute distress. Head: Normocephalic, no evidence injury, no tenderness, facial buttresses intact without stepoff. Face/sinus: No tenderness to palpation and percussion. Facial movement is normal and symmetric. Eyes: PERRL, EOMI. No scleral icterus, conjunctivae clear. Neuro: CN II exam reveals vision grossly intact.  No nystagmus at any point of gaze. Ears: Auricles well formed without lesions.  Bilateral cerumen impaction.  Nose: External evaluation reveals normal support and skin without lesions.  Dorsum is intact.  Anterior rhinoscopy reveals congested mucosa over anterior aspect of inferior turbinates and intact septum.  No purulence noted.  Oral:  Oral cavity and oropharynx are intact, symmetric, without erythema or edema.  Mucosa is moist without lesions. Neck: Full range of motion without pain.  There is no significant lymphadenopathy.  No masses palpable.  Thyroid bed within normal limits to palpation.  Parotid glands and submandibular glands equal bilaterally without mass.  Trachea is midline. Neuro:  CN 2-12 grossly intact.   Procedure: Bilateral cerumen disimpaction Anesthesia: None Description: Under the operating microscope, the cerumen is carefully removed with a combination of cerumen currette, alligator forceps, and suction catheters.  After the cerumen is removed, the TMs are noted to be normal.  No mass, erythema, or lesions. The patient tolerated the procedure well.    Assessment: 1.  Bilateral cerumen impaction.  After the disimpaction procedure, both tympanic membranes and middle ear spaces are noted to be normal. 2.  Likely transient conductive hearing loss, secondary to the cerumen impaction.  Plan: 1.  Otomicroscopy with bilateral cerumen disimpaction. 2.  The physical exam findings are reviewed with the patient and his father. 3.  The patient is instructed not to use Q-tips to clean his ear canals. 4.  The patient is encouraged to call with any questions or concerns.  Marvin Thompson 09/09/2023, 1:57 PM

## 2023-11-12 IMAGING — CT CT ANKLE*L* W/O CM
2 series · 14 of 27 positions shown, 18 images · non-contrast
Comparison: X-ray ankle 07/24/2021.

CLINICAL DATA: Pre-surgical planning. Left ankle fracture 9 days
ago related to an injury.

EXAM:
CT OF THE LEFT ANKLE WITHOUT CONTRAST
TECHNIQUE: Multidetector CT imaging of the left ankle was performed according
to the standard protocol. Multiplanar CT image reconstructions were
also generated.
RADIATION DOSE REDUCTION: This exam was performed according to the
departmental dose-optimization program which includes automated
exposure control, adjustment of the mA and/or kV according to
patient size and/or use of iterative reconstruction technique.

[Series 4: soft tissue lower extremity · axial · 0.40mm/px · z∈[-177,-11]mm · 9 of 99 slices shown, 12 images]
[im 8/99  soft-tissue]
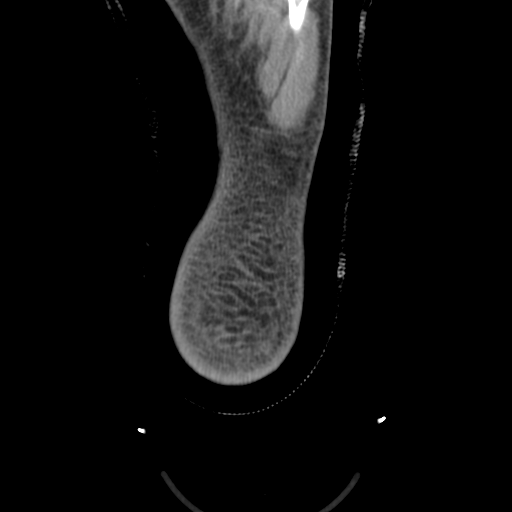
[im 8/99  bone]
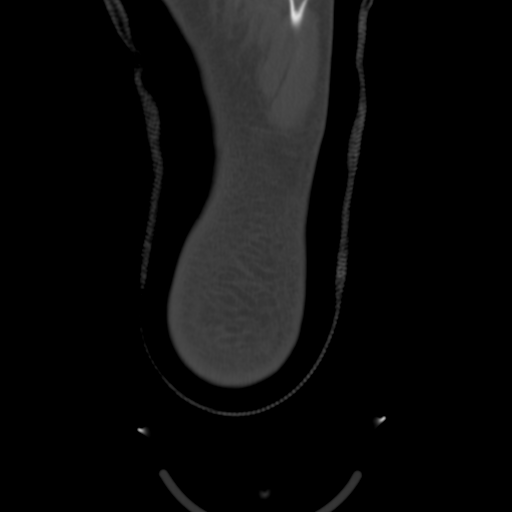
[im 23/99  bone]
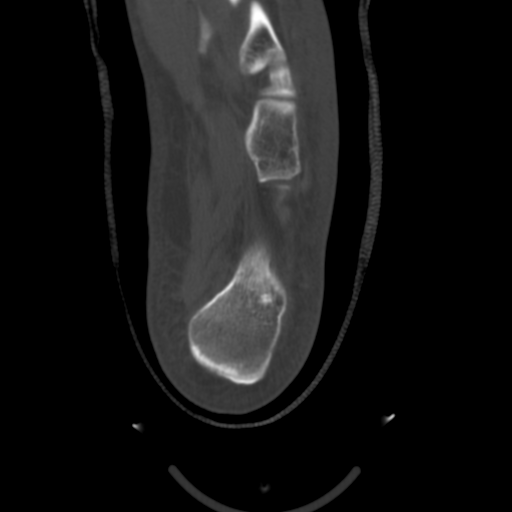
[im 31/99  bone]
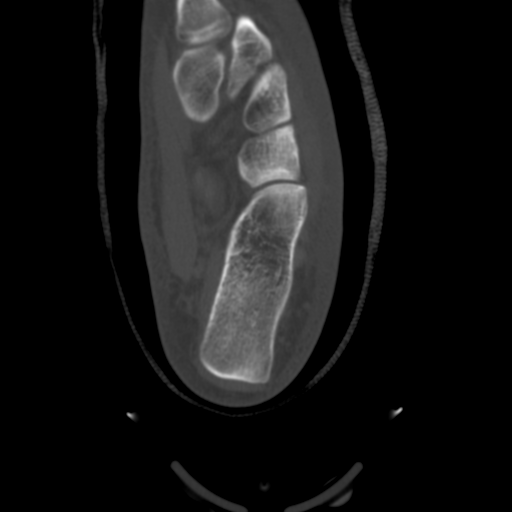
[im 38/99  bone]
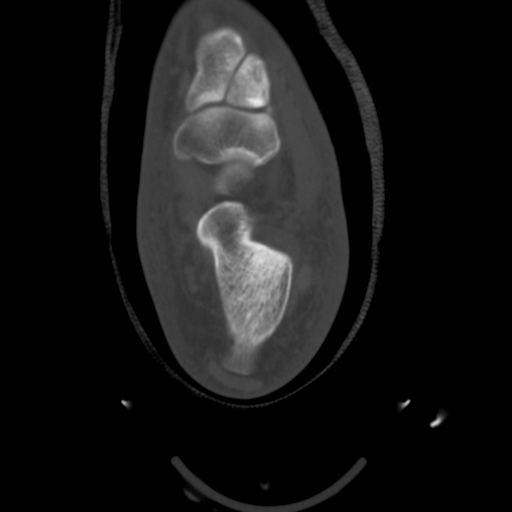
[im 53/99  soft-tissue]
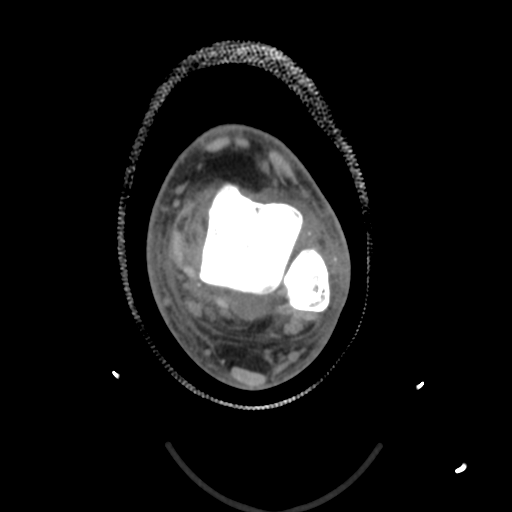
[im 53/99  bone]
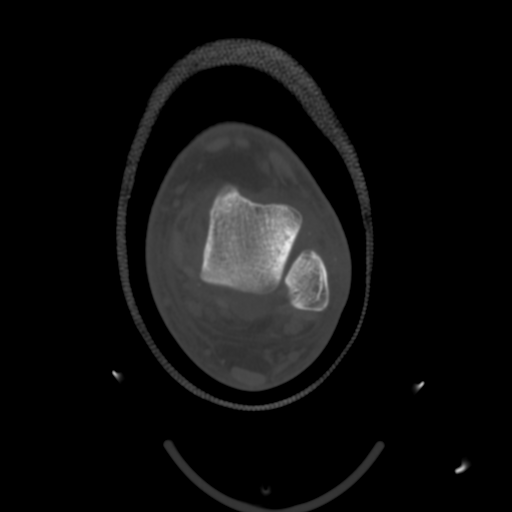
[im 61/99  bone]
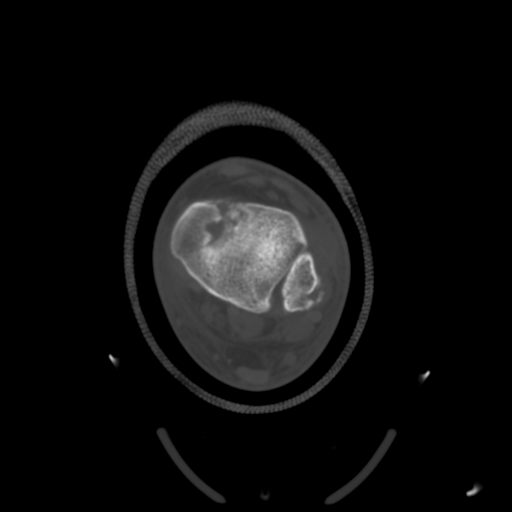
[im 68/99  bone]
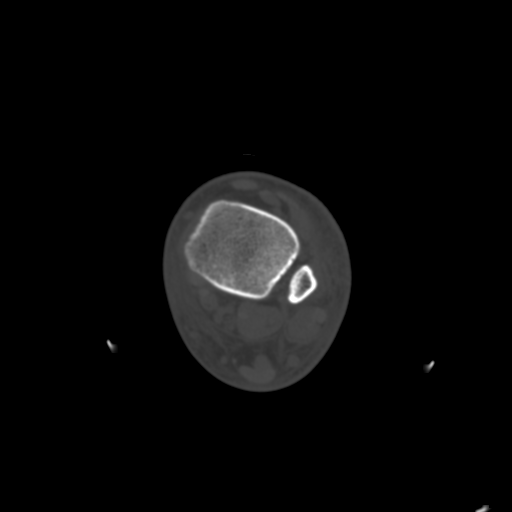
[im 83/99  bone]
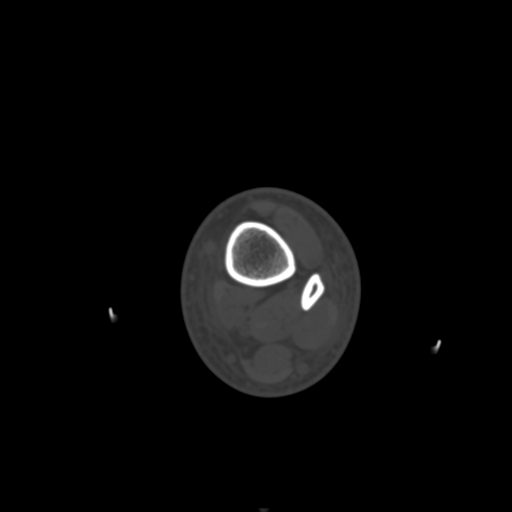
[im 91/99  soft-tissue]
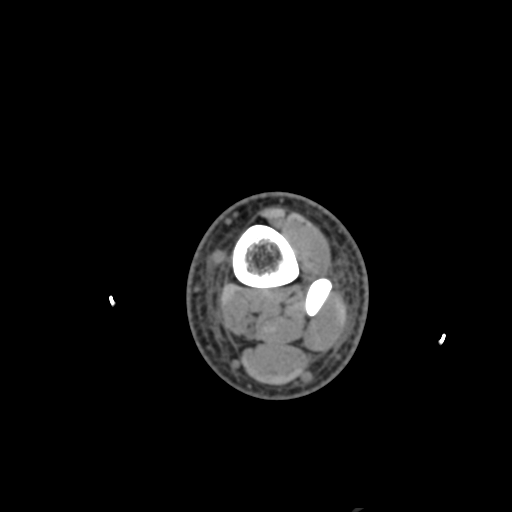
[im 91/99  bone]
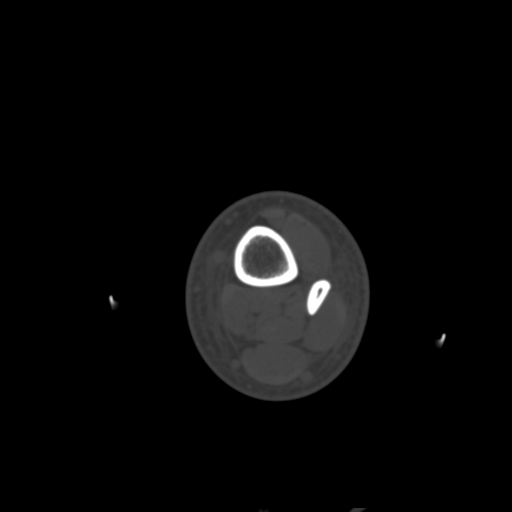

[Series 10: sagsoft tissue · sagittal · 0.31mm/px · 5 of 77 slices shown, 6 images]
[im 26/77  bone]
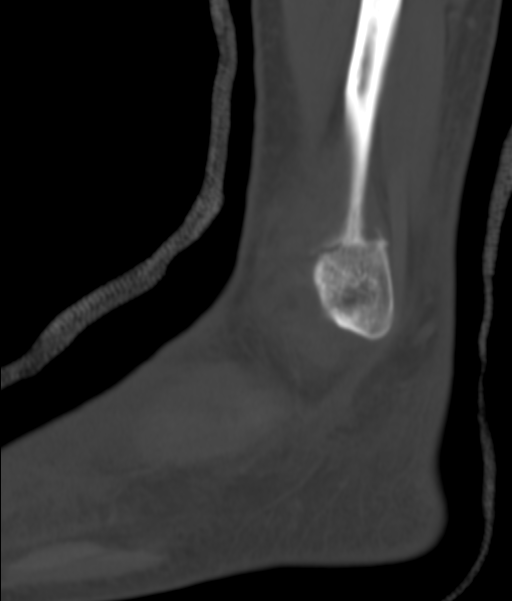
[im 32/77  bone]
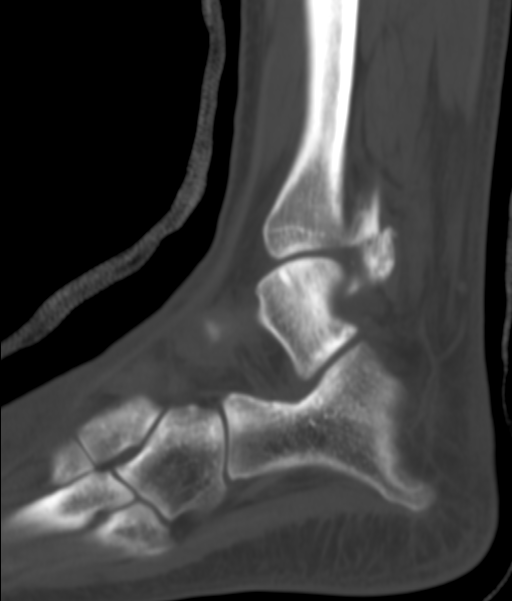
[im 39/77  soft-tissue]
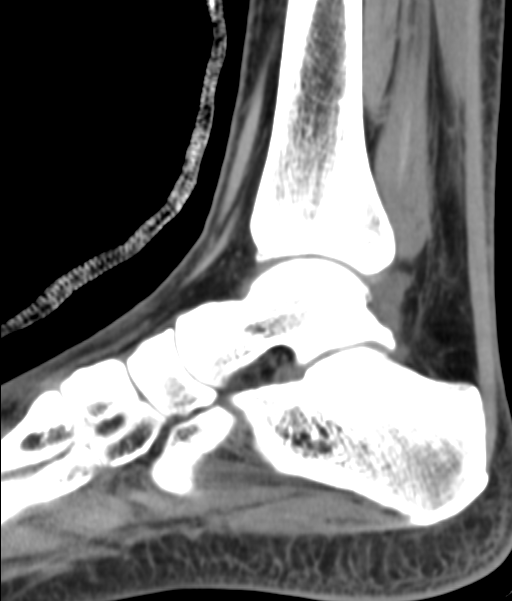
[im 39/77  bone]
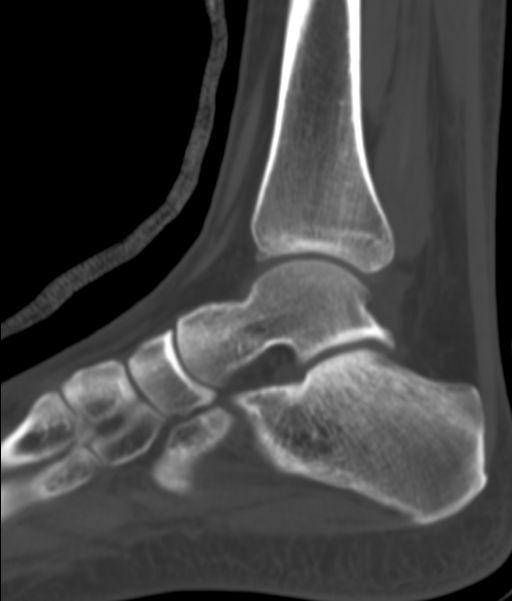
[im 45/77  bone]
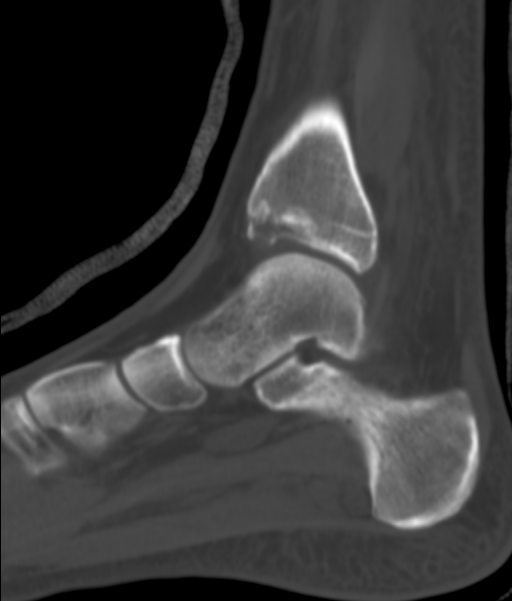
[im 51/77  bone]
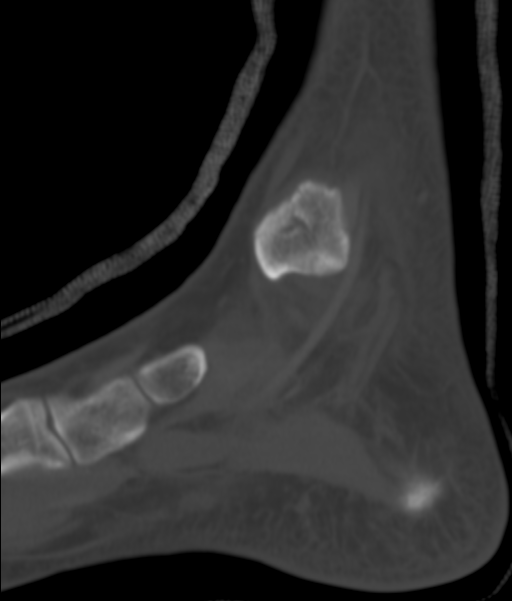

[14 of 27 positions shown; findings below may reference images not displayed]

FINDINGS: Bones/Joint/Cartilage

There is a Weber B type distal fibular fracture with 4-5 mm
posterolateral displacement. There is a nondisplaced fracture of the
base of the medial malleolus with horizontal extension across the
anterior tibial plafond. There is no posterior malleolar fracture.
Talar dome is intact. There is no other fracture visualized. Trace
tibiotalar joint effusion.

Ligaments

Suboptimally assessed by CT.

Muscles and Tendons

No evidence of tendon entrapment.

Soft tissues

Ankle soft tissue swelling.  No focal fluid collection.
IMPRESSION: Weber B type distal fibular fracture with 4-5 mm posterolateral
displacement. Nondisplaced fracture of the base of the medial
malleolus with horizontal extension across the anterior tibial
plafond.

Trace tibiotalar joint effusion.  Ankle soft tissue swelling.
